# Patient Record
Sex: Female | Born: 1950 | Race: Black or African American | Hispanic: No | Marital: Married | Smoking: Never smoker
Health system: Southern US, Community
[De-identification: ages and names within clinical notes are randomized; demographics above are authoritative.]

## PROBLEM LIST (undated history)

## (undated) DIAGNOSIS — I10 Essential (primary) hypertension: Secondary | ICD-10-CM

## (undated) DIAGNOSIS — K219 Gastro-esophageal reflux disease without esophagitis: Secondary | ICD-10-CM

## (undated) DIAGNOSIS — R7303 Prediabetes: Secondary | ICD-10-CM

---

## 1997-10-15 HISTORY — PX: ABDOMINAL HYSTERECTOMY: SUR658

## 2009-07-12 ENCOUNTER — Ambulatory Visit: Payer: Self-pay | Admitting: Internal Medicine

## 2011-06-05 IMAGING — CR DG ANKLE COMPLETE 3+V*L*
1 series · 4 of 4 positions shown · non-contrast
Comparison: none

REASON FOR EXAM: Ankle Sprain
COMMENTS:

PROCEDURE:     DXR - DXR ANKLE LEFT COMPLETE  - July 12, 2009 [DATE]
RESULT:     Images of the left ankle demonstrate what appears to be soft
tissue swelling. There is no fracture, dislocation or radiopaque foreign
body.

[Series 1: view not recorded · 0.17mm/px · 4 of 4 slices shown]
[im 1/4]
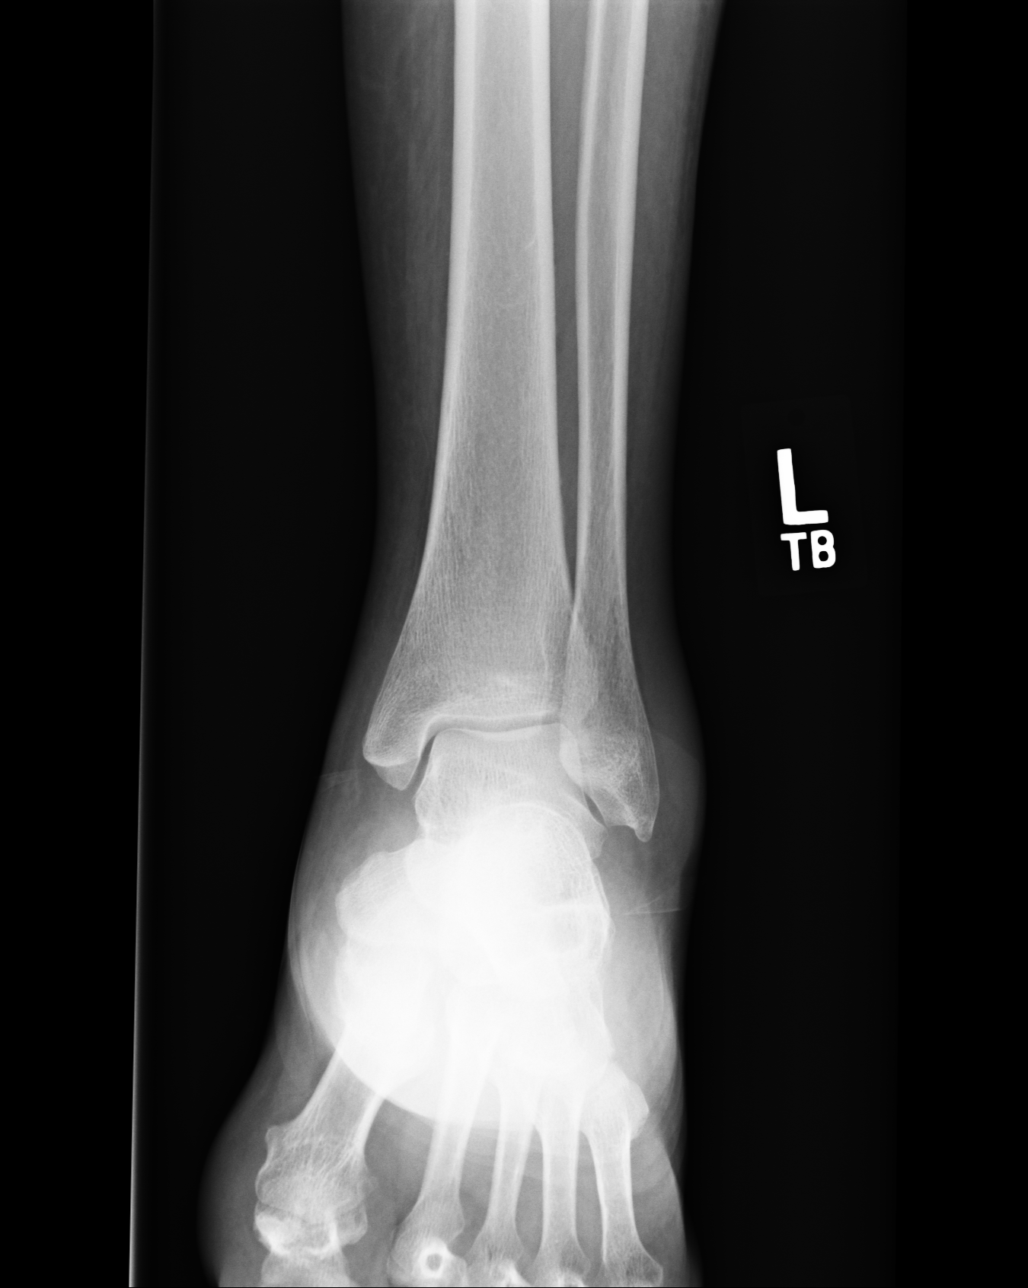
[im 2/4]
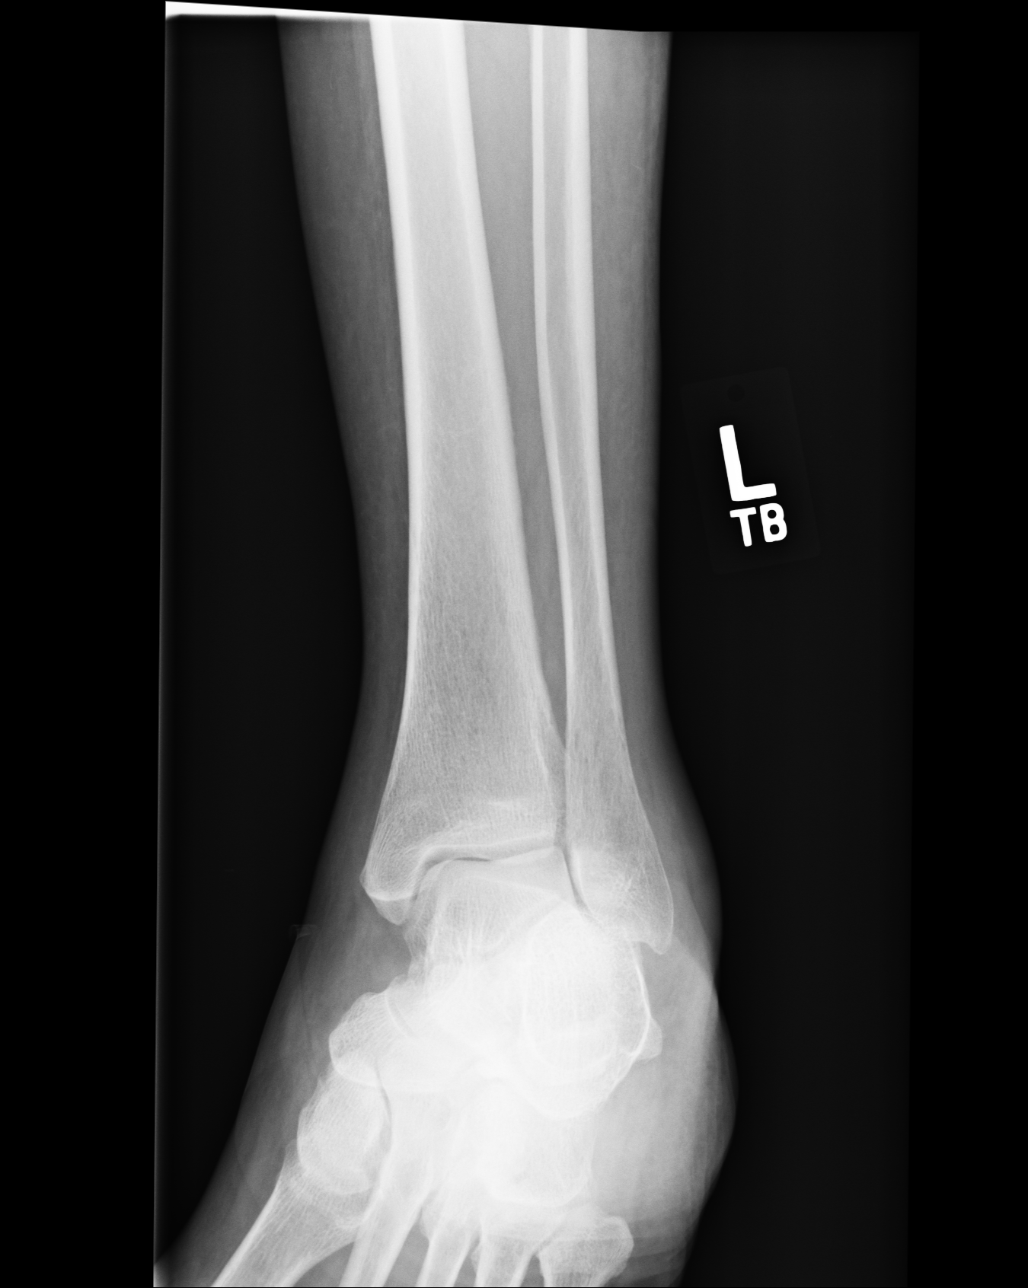
[im 3/4]
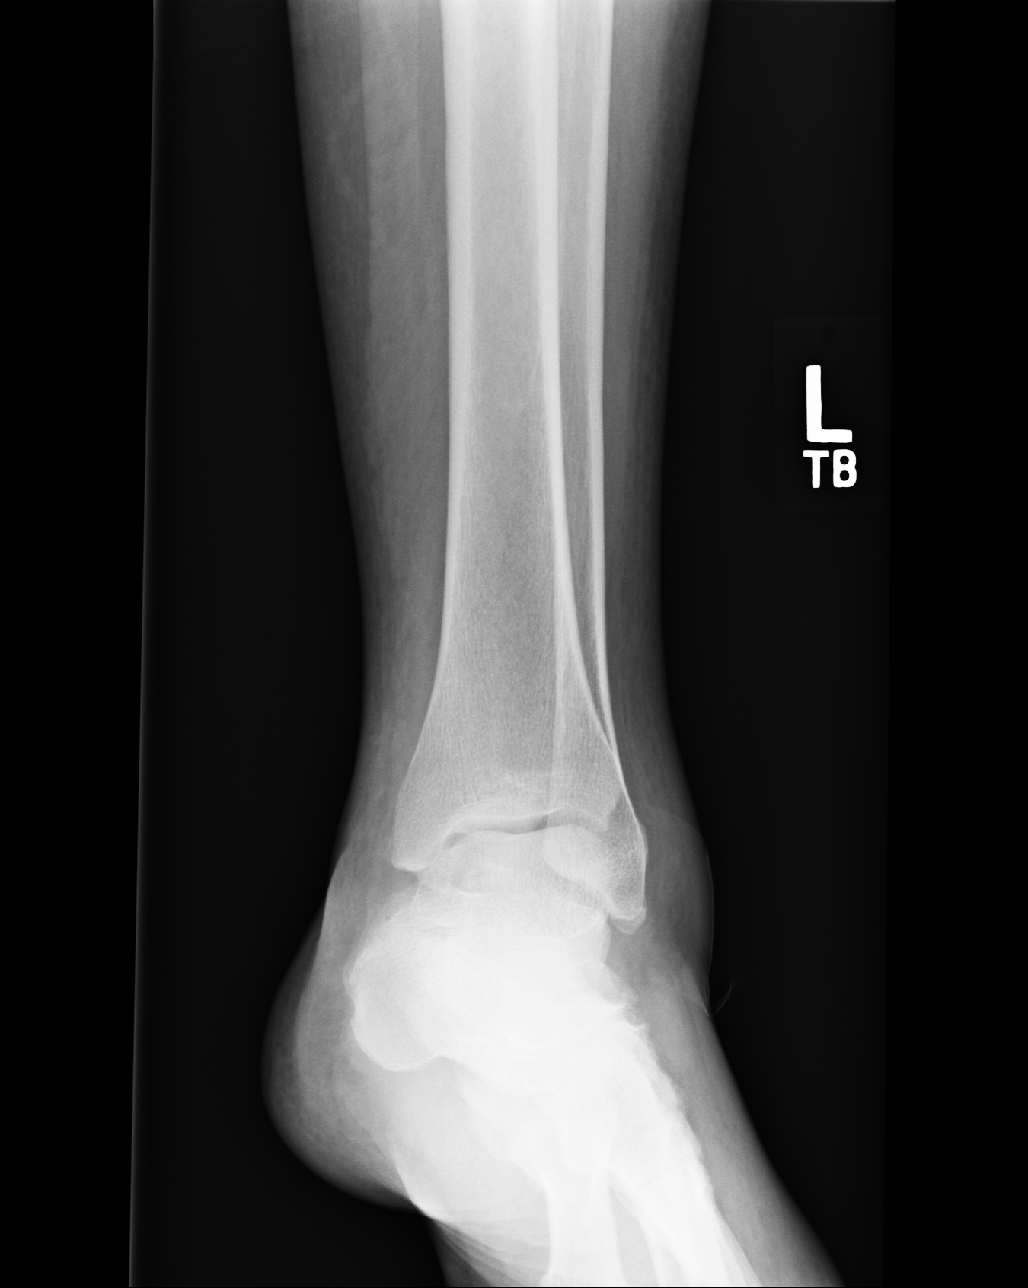
[im 4/4]
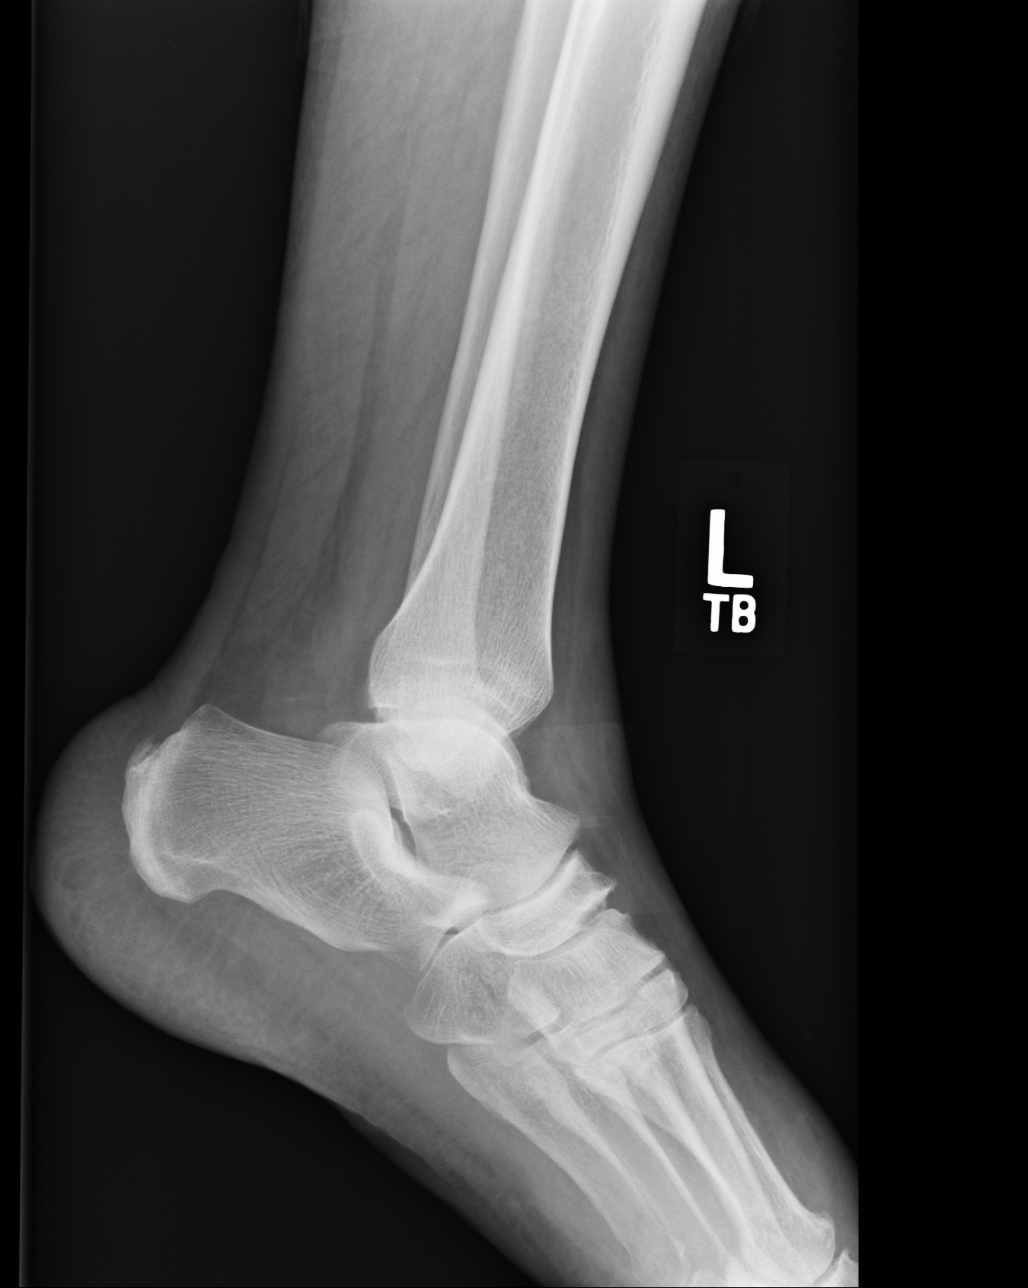

[4 of 4 positions shown; findings below may reference images not displayed]

IMPRESSION: Soft tissue swelling. No definite fracture demonstrated.
Followup images would be recommended if the patient's condition does not
improve.

## 2014-09-14 HISTORY — PX: EYE SURGERY: SHX253

## 2015-02-14 ENCOUNTER — Ambulatory Visit
Admission: EM | Admit: 2015-02-14 | Discharge: 2015-02-14 | Disposition: A | Discharge status: Home / Self Care | Payer: No Typology Code available for payment source | Attending: Family Medicine | Admitting: Family Medicine

## 2015-02-14 DIAGNOSIS — S39012A Strain of muscle, fascia and tendon of lower back, initial encounter: Secondary | ICD-10-CM | POA: Diagnosis not present

## 2015-02-14 DIAGNOSIS — M5432 Sciatica, left side: Secondary | ICD-10-CM

## 2015-02-14 HISTORY — DX: Gastro-esophageal reflux disease without esophagitis: K21.9

## 2015-02-14 HISTORY — DX: Essential (primary) hypertension: I10

## 2015-02-14 HISTORY — DX: Prediabetes: R73.03

## 2015-02-14 MED ORDER — HYDROCODONE-ACETAMINOPHEN 5-325 MG PO TABS: 1.0000 | ORAL_TABLET | Freq: Three times a day (TID) | ORAL | Status: AC

## 2015-02-14 MED ORDER — CYCLOBENZAPRINE HCL 10 MG PO TABS: 10.0000 mg | ORAL_TABLET | Freq: Three times a day (TID) | ORAL | Status: AC | PRN

## 2015-02-14 NOTE — Discharge Instructions (Signed)

## 2015-02-14 NOTE — ED Provider Notes (Signed)
CSN: [id number]     Arrival date & time 02/14/15  1452 History   First MD Initiated Contact with Patient 02/14/15 1542     Chief Complaint  Patient presents with  . Back Pain    Started thursday last week   (Consider location/radiation/quality/duration/timing/severity/associated sxs/prior Treatment) Patient is a 64 y.o. female presenting with back pain. The history is provided by the patient.  Back Pain Location:  Lumbar spine Radiates to:  L posterior upper leg Onset quality:  Sudden Timing:  Intermittent Context: lifting heavy objects and twisting   Context: not jumping from heights, not MVA, not occupational injury, not recent illness and not recent injury   Context comment:  Denies falls or specific injuries; started after she had been doing some lifting a mop bucket Relieved by:  Narcotics (took some left over oxycodone from previous problem and it helped some) Worsened by:  Twisting Associated symptoms: no abdominal pain, no bladder incontinence, no bowel incontinence, no dysuria, no fever, no numbness, no paresthesias, no perianal numbness, no weakness and no weight loss   Risk factors: no recent surgery     Past Medical History  Diagnosis Date  . Hypertension   . Borderline diabetes   . GERD (gastroesophageal reflux disease)    Past Surgical History  Procedure Laterality Date  . Abdominal hysterectomy  1999  . Eye surgery  09/2014   Family History  Problem Relation Age of Onset  . Stroke Father   . Dementia Mother    History  Substance Use Topics  . Smoking status: Never Smoker   . Smokeless tobacco: Not on file  . Alcohol Use: No   OB History    No data available     Review of Systems  Constitutional: Negative for fever and weight loss.  Gastrointestinal: Negative for abdominal pain and bowel incontinence.  Genitourinary: Negative for bladder incontinence and dysuria.  Musculoskeletal: Positive for back pain.  Neurological: Negative for weakness,  numbness and paresthesias.    Allergies  Metoprolol  Home Medications   Prior to Admission medications   Medication Sig Start Date End Date Taking? Authorizing Provider  amLODipine (NORVASC) 5 MG tablet Take 5 mg by mouth daily.   Yes Historical Provider, MD  lisinopril (PRINIVIL,ZESTRIL) 20 MG tablet Take 20 mg by mouth daily.   Yes Historical Provider, MD  omeprazole (PRILOSEC) 40 MG capsule Take 40 mg by mouth daily.   Yes Historical Provider, MD  cyclobenzaprine (FLEXERIL) 10 MG tablet Take 1 tablet (10 mg total) by mouth 3 (three) times daily as needed for muscle spasms. 02/14/15   Payton Mccallumrlando Charlot Gouin, MD  HYDROcodone-acetaminophen (NORCO/VICODIN) 5-325 MG per tablet Take 1-2 tablets by mouth every 8 (eight) hours. 02/14/15   Payton Mccallumrlando Jisele Price, MD   BP 132/76 mmHg  Pulse 62  Temp(Src) 98.5 F (36.9 C) (Tympanic)  Resp 18  Ht 5\' 8"  (1.727 m)  Wt 251 lb (113.853 kg)  BMI 38.17 kg/m2  SpO2 95% Physical Exam  Constitutional: She appears well-developed and well-nourished. No distress.  Musculoskeletal: She exhibits tenderness. She exhibits no edema.       Lumbar back: She exhibits tenderness (to lumbar paraspinous muscles bilaterally (L>R)) and spasm (lumbar paraspinous muscles). She exhibits normal range of motion, no bony tenderness, no swelling, no edema, no deformity, no laceration, no pain and normal pulse.  Neurological: She is alert. She has normal reflexes. She displays normal reflexes. She exhibits normal muscle tone.  Skin: Skin is warm and dry. No rash noted.  She is not diaphoretic. No erythema.  Nursing note and vitals reviewed.   ED Course  Procedures (including critical care time) Labs Review Labs Reviewed - No data to display  Imaging Review No results found.   MDM   1. Lumbar strain, initial encounter   2. Sciatica neuralgia, left    Discharge Medication List as of 02/14/2015  3:58 PM    START taking these medications   Details  cyclobenzaprine (FLEXERIL) 10 MG  tablet Take 1 tablet (10 mg total) by mouth 3 (three) times daily as needed for muscle spasms., Starting 02/14/2015, Until Discontinued, Normal    HYDROcodone-acetaminophen (NORCO/VICODIN) 5-325 MG per tablet Take 1-2 tablets by mouth every 8 (eight) hours., Starting 02/14/2015, Until Discontinued, Print        Plan: 1. diagnosis reviewed with patient 2. rx as per orders; risks, benefits, potential side effects reviewed with patient 3. Recommend supportive treatment with heat to area 4. F/u prn if symptoms worsen or don't improve  Payton Mccallum, MD 02/14/15 1615

## 2015-02-14 NOTE — ED Notes (Signed)
Patient states that pain started on Thursday and radiates through left thigh. Patient states that she has worsening pain with movement, bending or squatting. Patient states that she also has some cramping.

## 2017-03-26 DIAGNOSIS — I10 Essential (primary) hypertension: Secondary | ICD-10-CM | POA: Diagnosis not present

## 2017-03-26 DIAGNOSIS — E0822 Diabetes mellitus due to underlying condition with diabetic chronic kidney disease: Secondary | ICD-10-CM | POA: Diagnosis not present

## 2017-03-26 DIAGNOSIS — N181 Chronic kidney disease, stage 1: Secondary | ICD-10-CM | POA: Diagnosis not present

## 2017-03-26 DIAGNOSIS — R9439 Abnormal result of other cardiovascular function study: Secondary | ICD-10-CM | POA: Diagnosis not present

## 2017-03-26 DIAGNOSIS — R899 Unspecified abnormal finding in specimens from other organs, systems and tissues: Secondary | ICD-10-CM | POA: Diagnosis not present

## 2017-03-26 DIAGNOSIS — Z6841 Body Mass Index (BMI) 40.0 and over, adult: Secondary | ICD-10-CM | POA: Diagnosis not present

## 2017-03-26 DIAGNOSIS — Z9119 Patient's noncompliance with other medical treatment and regimen: Secondary | ICD-10-CM | POA: Diagnosis not present

## 2017-03-26 DIAGNOSIS — R569 Unspecified convulsions: Secondary | ICD-10-CM | POA: Diagnosis not present

## 2017-05-06 DIAGNOSIS — R569 Unspecified convulsions: Secondary | ICD-10-CM | POA: Diagnosis not present

## 2017-05-06 DIAGNOSIS — R42 Dizziness and giddiness: Secondary | ICD-10-CM | POA: Diagnosis not present

## 2017-05-10 DIAGNOSIS — E1122 Type 2 diabetes mellitus with diabetic chronic kidney disease: Secondary | ICD-10-CM | POA: Diagnosis not present

## 2017-05-10 DIAGNOSIS — M542 Cervicalgia: Secondary | ICD-10-CM | POA: Diagnosis not present

## 2017-05-10 DIAGNOSIS — Z6841 Body Mass Index (BMI) 40.0 and over, adult: Secondary | ICD-10-CM | POA: Diagnosis not present

## 2017-05-10 DIAGNOSIS — N183 Chronic kidney disease, stage 3 (moderate): Secondary | ICD-10-CM | POA: Diagnosis not present

## 2017-05-10 DIAGNOSIS — I10 Essential (primary) hypertension: Secondary | ICD-10-CM | POA: Diagnosis not present

## 2017-07-08 ENCOUNTER — Encounter: Payer: Self-pay | Admitting: Emergency Medicine

## 2017-07-08 ENCOUNTER — Emergency Department: Payer: Medicare HMO

## 2017-07-08 ENCOUNTER — Emergency Department
Admission: EM | Admit: 2017-07-08 | Discharge: 2017-07-08 | Disposition: A | Discharge status: Home / Self Care | Payer: Medicare HMO | Attending: Student in an Organized Health Care Education/Training Program | Admitting: Student in an Organized Health Care Education/Training Program

## 2017-07-08 DIAGNOSIS — S93491A Sprain of other ligament of right ankle, initial encounter: Secondary | ICD-10-CM | POA: Insufficient documentation

## 2017-07-08 DIAGNOSIS — M25571 Pain in right ankle and joints of right foot: Secondary | ICD-10-CM | POA: Diagnosis not present

## 2017-07-08 DIAGNOSIS — W07XXXA Fall from chair, initial encounter: Secondary | ICD-10-CM | POA: Diagnosis not present

## 2017-07-08 DIAGNOSIS — Z79899 Other long term (current) drug therapy: Secondary | ICD-10-CM | POA: Insufficient documentation

## 2017-07-08 DIAGNOSIS — I10 Essential (primary) hypertension: Secondary | ICD-10-CM | POA: Insufficient documentation

## 2017-07-08 DIAGNOSIS — Y999 Unspecified external cause status: Secondary | ICD-10-CM | POA: Insufficient documentation

## 2017-07-08 DIAGNOSIS — Y929 Unspecified place or not applicable: Secondary | ICD-10-CM | POA: Diagnosis not present

## 2017-07-08 DIAGNOSIS — Y939 Activity, unspecified: Secondary | ICD-10-CM | POA: Diagnosis not present

## 2017-07-08 MED ORDER — TRAMADOL HCL 50 MG PO TABS: 50.0000 mg | ORAL_TABLET | Freq: Four times a day (QID) | ORAL | 0 refills | Status: AC | PRN

## 2017-07-08 NOTE — ED Triage Notes (Signed)
Patient presents to ED via POV from home with c/o right ankle pain. Patient states she was attempting to sit on a kids stool when she rolled her ankle.

## 2017-07-08 NOTE — ED Provider Notes (Signed)
[facility] Emergency Department Provider Note  ____________________________________________  Time seen: Approximately 9:42 PM  I have reviewed the triage vital signs and the nursing notes.   HISTORY  Chief Complaint Ankle Pain    HPI Krystal [name] is a 66 y.o. female presents to the emergency department with 10 out of 10 [name] ankle pain after patient sustained an inversion type injury 2 days ago while sitting on a child's stool. Patient denies hitting her head or loss of consciousness. She denies weakness, radiculopathy or changes in sensation of the [name] lower extremity. No skin compromise. No alleviating measures have been attempted.  Past Medical History:  Diagnosis Date  . Borderline diabetes   . GERD (gastroesophageal reflux disease)   . Hypertension     There are no active problems to display for this patient.   Past Surgical History:  Procedure Laterality Date  . ABDOMINAL HYSTERECTOMY  1999  . EYE SURGERY  09/2014    Prior to Admission medications   Medication Sig Start Date End Date Taking? Authorizing Provider  amLODipine (NORVASC) 5 MG tablet Take 5 mg by mouth daily.    [provider]  cyclobenzaprine (FLEXERIL) 10 MG tablet Take 1 tablet (10 mg total) by mouth 3 (three) times daily as needed for muscle spasms. 02/14/15   Payton Mccallum, MD  HYDROcodone-acetaminophen (NORCO/VICODIN) 5-325 MG per tablet Take 1-2 tablets by mouth every 8 (eight) hours. 02/14/15   Payton Mccallum, MD  lisinopril (PRINIVIL,ZESTRIL) 20 MG tablet Take 20 mg by mouth daily.    [provider]  omeprazole (PRILOSEC) 40 MG capsule Take 40 mg by mouth daily.    [provider]    Allergies Metoprolol  Family History  Problem Relation Age of Onset  . Stroke Father   . Dementia Mother     Social History Social History  Substance Use Topics  . Smoking status: Never Smoker  . Smokeless tobacco: Not on file  . Alcohol use No      Review of Systems  Constitutional: No fever/chills Eyes: No visual changes. No discharge ENT: No upper respiratory complaints. Cardiovascular: no chest pain. Respiratory: no cough. No SOB. Musculoskeletal: Patient has [name] ankle pain.  Skin: Negative for rash, abrasions, lacerations, ecchymosis. Neurological: Negative for headaches, focal weakness or numbness.   ____________________________________________   PHYSICAL EXAM:  VITAL SIGNS: ED Triage Vitals  Enc Vitals Group     BP 07/08/17 2029 (!) 156/84     Pulse Rate 07/08/17 2029 80     Resp 07/08/17 2029 16     Temp 07/08/17 2029 99.3 F (37.4 C)     Temp Source 07/08/17 2029 Oral     SpO2 07/08/17 2029 97 %     Weight 07/08/17 2029 260 lb (117.9 kg)     Height 07/08/17 2029  (1.575 m)     Head Circumference --      Peak Flow --      Pain Score 07/08/17 2028 8     Pain Loc --      Pain Edu? --      Excl. in GC? --      Constitutional: Alert and oriented. Well appearing and in no acute distress. Eyes: Conjunctivae are normal. PERRL. EOMI. Head: Atraumatic. Cardiovascular: Normal rate, regular rhythm. Normal S1 and S2.  Good peripheral circulation. Respiratory: Normal respiratory effort without tachypnea or retractions. Lungs CTAB. Good air entry to the bases with no decreased or absent breath sounds. Musculoskeletal: Patient  is able to perform limited range of motion at the [name] ankle, likely secondary to pain. She is able to move all 5 [name] toes. No pain with palpation of the course of the [name] fibula. Palpable dorsalis pedis pulse, [name]. Neurologic:  Normal speech and language. No gross focal neurologic deficits are appreciated.  Skin:  Skin is warm, dry and intact. No rash noted. Psychiatric: Mood and affect are normal. Speech and behavior are normal. Patient exhibits appropriate insight and judgement.   ____________________________________________   LABS (all labs ordered are listed, but only  abnormal results are displayed)  Labs Reviewed - No data to display ____________________________________________  EKG   ____________________________________________  RADIOLOGY Krystal [name], personally viewed and evaluated these images (plain radiographs) as part of my medical decision making, as well as reviewing the written report by the radiologist.  Krystal [name]  Result Date: 07/08/2017 CLINICAL DATA:  [name] ankle pain after a fall on Saturday. EXAM: [name] ANKLE - COMPLETE 3+ VIEW COMPARISON:  None. FINDINGS: No fracture. No subluxation or dislocation. Benign appearing sclerotic focus identified distal fibula. IMPRESSION: No acute abnormality. Electronically Signed   By: Kennith Center M.D.   On: 07/08/2017 20:53    ____________________________________________    PROCEDURES  Procedure(s) performed:    Procedures    Medications - No data to display   ____________________________________________   INITIAL IMPRESSION / ASSESSMENT AND PLAN / ED COURSE  Pertinent labs & imaging results that were available during my care of the patient were reviewed by me and considered in my medical decision making (see chart for details).  Review of the [place] CSRS was performed in accordance of the [facility] prior to dispensing any controlled drugs.     Assessment and plan [name] ankle pain Patient presents to the emergency department with [name] ankle pain after an inversion-type injury. X-ray examination reveals no acute fractures or bony abnormalities. On physical exam, patient had pain with palpation of the [name] anterior talofibular ligament. An ace wrap was applied in the emergency department and crutches were provided. Rest, ice, compression and elevation were recommended. A referral was made to podiatry. Patient was discharged with tramadol. All patient questions were answered.    ____________________________________________  FINAL CLINICAL IMPRESSION(S) / ED  DIAGNOSES  Final diagnoses:  None      NEW MEDICATIONS STARTED DURING THIS VISIT:  New Prescriptions   No medications on file        This chart was dictated using voice recognition software/Dragon. Despite best efforts to proofread, errors can occur which can change the meaning. Any change was purely unintentional.    Krystal [name] 07/08/17 2153    Krystal Eddy, MD 07/08/17 [phone]

## 2017-08-23 DIAGNOSIS — E1122 Type 2 diabetes mellitus with diabetic chronic kidney disease: Secondary | ICD-10-CM | POA: Diagnosis not present

## 2017-08-23 DIAGNOSIS — J208 Acute bronchitis due to other specified organisms: Secondary | ICD-10-CM | POA: Diagnosis not present

## 2017-08-23 DIAGNOSIS — B9689 Other specified bacterial agents as the cause of diseases classified elsewhere: Secondary | ICD-10-CM | POA: Diagnosis not present

## 2017-08-23 DIAGNOSIS — I10 Essential (primary) hypertension: Secondary | ICD-10-CM | POA: Diagnosis not present

## 2017-08-23 DIAGNOSIS — N183 Chronic kidney disease, stage 3 (moderate): Secondary | ICD-10-CM | POA: Diagnosis not present

## 2017-11-29 DIAGNOSIS — E1122 Type 2 diabetes mellitus with diabetic chronic kidney disease: Secondary | ICD-10-CM | POA: Diagnosis not present

## 2017-11-29 DIAGNOSIS — N183 Chronic kidney disease, stage 3 (moderate): Secondary | ICD-10-CM | POA: Diagnosis not present

## 2017-11-29 DIAGNOSIS — I10 Essential (primary) hypertension: Secondary | ICD-10-CM | POA: Diagnosis not present

## 2017-11-29 DIAGNOSIS — K219 Gastro-esophageal reflux disease without esophagitis: Secondary | ICD-10-CM | POA: Diagnosis not present

## 2018-03-17 ENCOUNTER — Other Ambulatory Visit: Payer: Self-pay | Admitting: Pharmacist

## 2018-03-17 NOTE — Patient Outreach (Addendum)
[facility]) Care Management  03/17/2018  Krystal [name] 02-May-1951 [id number]   Incoming call from Ronnie Doss in response to the [facility] Medication Adherence Campaign. Speak with patient. HIPAA identifiers verified and verbal consent received.  [name] reports that she takes her pioglitazone once daily as directed, but that she often misses doses of this medication because she does not like the side effects. Patient reports having experienced fluctuating blood sugars, weight gain and "feeling bad" with this medication. Counsel patient about the importance of blood sugar control. Note that patient's most recent A1C in [place] was 7.5% and her eGFR was ~54 mL/min on 11/29/17.   Also note that per Epic Chart review, patient's PCP previously prescribed the patient Januvia for blood sugar control, but the patient reported that she was unable to afford the medication at that time because the copayment was ~$300. Note that per the [place], Chilton [place] is a tier 3 option for the patient, with a $45 copayment. Januvia is a weight-neutral option. Patient has a BMI over 40.  [name] reports that over time this $45/month copayment would be difficult for her to afford. Review with patient the requirements for Extra Help through Social Security for lower drug costs. [name] reports that she would qualify. Patient states that she will complete this application online today. Provide patient with instructions for accessing the application online. Let patient know that, once she has submitted the application, she should expect to receive a response in the mail in 2-4 weeks. Advise patient that, even if she is denied for Extra Help, we can assist her with applying for assistance with the cost of the medication directly from the manufacturer, [employer].  [name] requests that I reach out to her PCP, Dr. Candiss Norse to let the provider know that [name] is currently not  consistently taking her pioglitazone due to side effects and to ask if she would consider switching the patient back to [facility] for improved tolerability and adherence.  PLAN:  1) Will call to follow up with patient's PCP, Dr. Candiss Norse now regarding potentially switching patient from pioglitazone to [facility] for improved medication tolerability and adherence.  2) Will call to follow up with [name] once I have heard back from Dr. Candiss Norse.  Harlow Asa, PharmD, [place] Management [phone]

## 2018-03-17 NOTE — Patient Outreach (Signed)
[facility]) Care Management  03/17/2018  Krystal [name] 11/10/1950 [id number]   Call to follow up with patient's PCP, Dr. Thedore MinsSingh regarding potentially switching patient from [facility]oglitazone to [facility] for improved medication tolerability and adherence. Leave a message with [name] in Dr. Doristine ChurchSingh's office.  [name], PharmD, [facility] CenterBCACP Clinical Pharmacist [facility] Care Management [phone]

## 2018-03-18 ENCOUNTER — Other Ambulatory Visit: Payer: Self-pay | Admitting: Pharmacy Technician

## 2018-03-18 ENCOUNTER — Other Ambulatory Visit: Payer: Self-pay | Admitting: Pharmacist

## 2018-03-18 NOTE — Patient Outreach (Signed)
[facility]) Care Management  03/18/2018  Krystal [name] [birth year] [id number]   Received [employer] patient assistance referral from [facility] RPh Duanne MoronElisabeth Dhalla for Januvia. Prepared patient and provider Thedore Mins(Singh) portion to be mailed out.  Will follow up with patient in the next 7-10 business days to verify application has been received.  Suzan SlickAshley N. Ernesta Ambleoleman, CPhT [facility] Care Management [phone]

## 2018-03-18 NOTE — Patient Outreach (Signed)
[facility]) Care Management  03/18/2018  Otila BackFaye R Demetro Aug 28, 1951 [id number]   Outreach to follow up with patient regarding medication assistance. Note that per Epic chart review, patient's PCP, Dr. Thedore MinsSingh agreed that patient may switch from Actos to [facility] for cost savings/improved adherence. Called and spoke with patient. HIPAA identifiers verified and verbal consent received.  Ms. Gayleen OremOldham reports that she got the call from her PCP's office regarding this change. Reports that she does not currently have the $45 copayment to be able to pick up the Januvia. Reports that she just picked up a refill of her pioglitazone and that she plans to just continue this medication for now until she is able to switch. Patient would like our help with applying for Patient Assistance from [employer] for the Januvia. In addition, Ms. Gayleen OremOldham reports that she is going to complete the extra help application online and that she has also reached out to [facility] about having her insurance premium lowered based on her financial information.   PLAN  1) Will send an InBasket message to Pharmacy Technician Lilla Shookshley Coleman to request that she reach out to Ms. Effie ShyColeman to assist her with applying for patient assistance for Januvia from [employer].  2) Will follow with Morrie Sheldonshley as she assists Ms. Matsushita with this application.  Duanne MoronElisabeth Easten Maceachern, PharmD, [facility]BCACP Clinical Pharmacist [facility] Care Management [phone]

## 2018-03-26 DIAGNOSIS — E1122 Type 2 diabetes mellitus with diabetic chronic kidney disease: Secondary | ICD-10-CM | POA: Diagnosis not present

## 2018-03-26 DIAGNOSIS — N183 Chronic kidney disease, stage 3 (moderate): Secondary | ICD-10-CM | POA: Diagnosis not present

## 2018-04-02 DIAGNOSIS — N183 Chronic kidney disease, stage 3 (moderate): Secondary | ICD-10-CM | POA: Diagnosis not present

## 2018-04-02 DIAGNOSIS — E1122 Type 2 diabetes mellitus with diabetic chronic kidney disease: Secondary | ICD-10-CM | POA: Diagnosis not present

## 2018-04-11 ENCOUNTER — Other Ambulatory Visit: Payer: Self-pay | Admitting: Pharmacy Technician

## 2018-04-11 NOTE — Patient Outreach (Signed)
[facility]) Care Management  04/11/2018  Krystal [name] April 28, 1951 [id number]   Unsuccessful outreach attempt #1 in regards to [employer] patient assistance, Left HIPAA compliant voicemail.  Will make call attempt #2 in 2-3 business days if call is not returned.  Suzan SlickAshley N. Ernesta Ambleoleman, CPhT [facility] Care Management [phone]

## 2018-04-16 ENCOUNTER — Ambulatory Visit: Payer: Self-pay | Admitting: Pharmacy Technician

## 2018-04-18 ENCOUNTER — Other Ambulatory Visit: Payer: Self-pay | Admitting: Pharmacy Technician

## 2018-04-18 NOTE — Patient Outreach (Signed)
[facility]) Care Management  04/18/2018  Krystal [name] 1951/08/02 [id number]   Successful call attempt, HIPAA identifiers verified. Krystal [name] states that she did receive the [employer] patient assistance application, she also stated that she received letters in the mail from [facility] denying a medication but she wasn't clear as to if it had to do with Krystal [name]. She stated she went to her providers office for a check up and Dr. Thedore MinsSingh gave her the impression that she didn't know anything about the letter. Krystal [name] was unclear about what letter she was asking the provider about... When I asked her if she wanted to proceed with the application process she stated she wasn't sure. I told her that Dr. Thedore MinsSingh had already mailed in her portion of the application for Krystal [name] so I would just need her portion. She still stated that she doesn't know what she should do. I suggested that she attempt to contact Dr. Doristine ChurchSingh's office and verify if Krystal [name] is a drug that she should continue on and if they agree that it is, she can contact me if she wants to proceed with the process.  Will route note to [facility] Duanne MoronElisabeth Dhalla for case closure.  Suzan SlickAshley N. Ernesta Ambleoleman, CPhT [facility] Care Management [phone]

## 2018-04-23 ENCOUNTER — Other Ambulatory Visit: Payer: Self-pay | Admitting: Pharmacist

## 2018-04-23 NOTE — Patient Outreach (Signed)
[facility]) Care Management  04/23/2018  Krystal [name] 1951-01-01 [id number]   Call to follow up with patient regarding medication assistance for Januvia. Leave a HIPAA compliant message on the patient's voicemail. If have not heard from patient by 04/25/18, will give her another call at that time.  Duanne MoronElisabeth Shoshanah Dapper, PharmD, [facility] ViewBCACP Clinical Pharmacist [facility] Care Management [phone]

## 2018-04-25 ENCOUNTER — Other Ambulatory Visit: Payer: Self-pay | Admitting: Pharmacist

## 2018-04-25 NOTE — Patient Outreach (Signed)
[facility]) Care Management  04/25/2018  Krystal [name] 1950-10-21 [id number]   Call to follow up with [name] regarding medication assistance for Januvia. HIPAA identifiers verified and verbal consent received.  Patient reports that she received a letter in the mail from [employer] denying the Januvia. Counsel patient that the process for applying for Patient Assistance for Januvia through the manufacturer is a separate program from her insurance. Again, walk patient through the application process. Patient confirms that she is aware that her PCP would like for her to start on the Januvia. However, patient expresses that she still feels uneasy with how many steps there are to this application process. Reports that she is not sure if she would like to proceed with the application at this time and that she would prefer to just give me a call back if/when she is ready. Confirm that patient has my phone number.  Will close pharmacy episode at this time.  Duanne MoronElisabeth Tomeka Kantner, PharmD, [facility]BCACP Clinical Pharmacist [facility] Care Management [phone]

## 2018-06-27 DIAGNOSIS — E1122 Type 2 diabetes mellitus with diabetic chronic kidney disease: Secondary | ICD-10-CM | POA: Diagnosis not present

## 2018-06-27 DIAGNOSIS — N183 Chronic kidney disease, stage 3 (moderate): Secondary | ICD-10-CM | POA: Diagnosis not present

## 2018-07-04 DIAGNOSIS — I10 Essential (primary) hypertension: Secondary | ICD-10-CM | POA: Diagnosis not present

## 2018-07-04 DIAGNOSIS — E1122 Type 2 diabetes mellitus with diabetic chronic kidney disease: Secondary | ICD-10-CM | POA: Diagnosis not present

## 2018-07-04 DIAGNOSIS — Z780 Asymptomatic menopausal state: Secondary | ICD-10-CM | POA: Diagnosis not present

## 2018-07-04 DIAGNOSIS — Z6841 Body Mass Index (BMI) 40.0 and over, adult: Secondary | ICD-10-CM | POA: Diagnosis not present

## 2018-07-04 DIAGNOSIS — N183 Chronic kidney disease, stage 3 (moderate): Secondary | ICD-10-CM | POA: Diagnosis not present

## 2018-07-04 DIAGNOSIS — Z0000 Encounter for general adult medical examination without abnormal findings: Secondary | ICD-10-CM | POA: Diagnosis not present

## 2018-09-09 DIAGNOSIS — J069 Acute upper respiratory infection, unspecified: Secondary | ICD-10-CM | POA: Diagnosis not present

## 2018-09-09 DIAGNOSIS — E1122 Type 2 diabetes mellitus with diabetic chronic kidney disease: Secondary | ICD-10-CM | POA: Diagnosis not present

## 2018-09-09 DIAGNOSIS — N183 Chronic kidney disease, stage 3 (moderate): Secondary | ICD-10-CM | POA: Diagnosis not present

## 2018-09-26 DIAGNOSIS — N183 Chronic kidney disease, stage 3 (moderate): Secondary | ICD-10-CM | POA: Diagnosis not present

## 2018-09-26 DIAGNOSIS — E1122 Type 2 diabetes mellitus with diabetic chronic kidney disease: Secondary | ICD-10-CM | POA: Diagnosis not present

## 2018-10-06 DIAGNOSIS — R05 Cough: Secondary | ICD-10-CM | POA: Diagnosis not present

## 2018-10-06 DIAGNOSIS — N183 Chronic kidney disease, stage 3 (moderate): Secondary | ICD-10-CM | POA: Diagnosis not present

## 2018-10-06 DIAGNOSIS — I10 Essential (primary) hypertension: Secondary | ICD-10-CM | POA: Diagnosis not present

## 2018-10-06 DIAGNOSIS — E1122 Type 2 diabetes mellitus with diabetic chronic kidney disease: Secondary | ICD-10-CM | POA: Diagnosis not present

## 2018-10-06 DIAGNOSIS — M170 Bilateral primary osteoarthritis of knee: Secondary | ICD-10-CM | POA: Diagnosis not present

## 2018-10-30 DIAGNOSIS — E1122 Type 2 diabetes mellitus with diabetic chronic kidney disease: Secondary | ICD-10-CM | POA: Diagnosis not present

## 2018-10-30 DIAGNOSIS — N183 Chronic kidney disease, stage 3 (moderate): Secondary | ICD-10-CM | POA: Diagnosis not present

## 2018-10-30 DIAGNOSIS — M722 Plantar fascial fibromatosis: Secondary | ICD-10-CM | POA: Diagnosis not present

## 2019-02-17 DIAGNOSIS — E1122 Type 2 diabetes mellitus with diabetic chronic kidney disease: Secondary | ICD-10-CM | POA: Diagnosis not present

## 2019-02-17 DIAGNOSIS — I10 Essential (primary) hypertension: Secondary | ICD-10-CM | POA: Diagnosis not present

## 2019-02-17 DIAGNOSIS — Z6841 Body Mass Index (BMI) 40.0 and over, adult: Secondary | ICD-10-CM | POA: Diagnosis not present

## 2019-02-17 DIAGNOSIS — N183 Chronic kidney disease, stage 3 (moderate): Secondary | ICD-10-CM | POA: Diagnosis not present

## 2019-02-17 DIAGNOSIS — Z0000 Encounter for general adult medical examination without abnormal findings: Secondary | ICD-10-CM | POA: Diagnosis not present

## 2019-03-18 DIAGNOSIS — Z961 Presence of intraocular lens: Secondary | ICD-10-CM | POA: Diagnosis not present

## 2019-03-18 DIAGNOSIS — H524 Presbyopia: Secondary | ICD-10-CM | POA: Diagnosis not present

## 2019-03-18 DIAGNOSIS — H52223 Regular astigmatism, bilateral: Secondary | ICD-10-CM | POA: Diagnosis not present

## 2019-03-18 DIAGNOSIS — E119 Type 2 diabetes mellitus without complications: Secondary | ICD-10-CM | POA: Diagnosis not present

## 2019-03-18 DIAGNOSIS — H04123 Dry eye syndrome of bilateral lacrimal glands: Secondary | ICD-10-CM | POA: Diagnosis not present

## 2019-03-18 DIAGNOSIS — Z0100 Encounter for examination of eyes and vision without abnormal findings: Secondary | ICD-10-CM | POA: Diagnosis not present

## 2019-03-18 DIAGNOSIS — Z7984 Long term (current) use of oral hypoglycemic drugs: Secondary | ICD-10-CM | POA: Diagnosis not present

## 2019-03-18 DIAGNOSIS — H5213 Myopia, bilateral: Secondary | ICD-10-CM | POA: Diagnosis not present

## 2019-05-13 DIAGNOSIS — E1122 Type 2 diabetes mellitus with diabetic chronic kidney disease: Secondary | ICD-10-CM | POA: Diagnosis not present

## 2019-05-13 DIAGNOSIS — N183 Chronic kidney disease, stage 3 (moderate): Secondary | ICD-10-CM | POA: Diagnosis not present

## 2019-05-13 DIAGNOSIS — I10 Essential (primary) hypertension: Secondary | ICD-10-CM | POA: Diagnosis not present

## 2019-11-25 DIAGNOSIS — I129 Hypertensive chronic kidney disease with stage 1 through stage 4 chronic kidney disease, or unspecified chronic kidney disease: Secondary | ICD-10-CM | POA: Diagnosis not present

## 2019-11-25 DIAGNOSIS — E1122 Type 2 diabetes mellitus with diabetic chronic kidney disease: Secondary | ICD-10-CM | POA: Diagnosis not present

## 2019-11-25 DIAGNOSIS — E785 Hyperlipidemia, unspecified: Secondary | ICD-10-CM | POA: Diagnosis not present

## 2019-11-25 DIAGNOSIS — N183 Chronic kidney disease, stage 3 unspecified: Secondary | ICD-10-CM | POA: Diagnosis not present

## 2019-11-25 DIAGNOSIS — M25511 Pain in right shoulder: Secondary | ICD-10-CM | POA: Diagnosis not present

## 2019-11-25 DIAGNOSIS — Z87891 Personal history of nicotine dependence: Secondary | ICD-10-CM | POA: Diagnosis not present

## 2019-12-05 DIAGNOSIS — R519 Headache, unspecified: Secondary | ICD-10-CM | POA: Diagnosis not present

## 2019-12-05 DIAGNOSIS — Z7982 Long term (current) use of aspirin: Secondary | ICD-10-CM | POA: Diagnosis not present

## 2019-12-05 DIAGNOSIS — M109 Gout, unspecified: Secondary | ICD-10-CM | POA: Diagnosis not present

## 2019-12-05 DIAGNOSIS — R5383 Other fatigue: Secondary | ICD-10-CM | POA: Diagnosis not present

## 2019-12-05 DIAGNOSIS — R600 Localized edema: Secondary | ICD-10-CM | POA: Diagnosis not present

## 2019-12-05 DIAGNOSIS — K219 Gastro-esophageal reflux disease without esophagitis: Secondary | ICD-10-CM | POA: Diagnosis not present

## 2019-12-05 DIAGNOSIS — E119 Type 2 diabetes mellitus without complications: Secondary | ICD-10-CM | POA: Diagnosis not present

## 2019-12-05 DIAGNOSIS — I129 Hypertensive chronic kidney disease with stage 1 through stage 4 chronic kidney disease, or unspecified chronic kidney disease: Secondary | ICD-10-CM | POA: Diagnosis not present

## 2019-12-05 DIAGNOSIS — I10 Essential (primary) hypertension: Secondary | ICD-10-CM | POA: Diagnosis not present

## 2019-12-05 DIAGNOSIS — R29818 Other symptoms and signs involving the nervous system: Secondary | ICD-10-CM | POA: Diagnosis not present

## 2019-12-05 DIAGNOSIS — E785 Hyperlipidemia, unspecified: Secondary | ICD-10-CM | POA: Diagnosis not present

## 2019-12-05 DIAGNOSIS — R0789 Other chest pain: Secondary | ICD-10-CM | POA: Diagnosis not present

## 2019-12-07 DIAGNOSIS — I10 Essential (primary) hypertension: Secondary | ICD-10-CM | POA: Diagnosis not present

## 2019-12-07 DIAGNOSIS — Z87891 Personal history of nicotine dependence: Secondary | ICD-10-CM | POA: Diagnosis not present

## 2019-12-15 ENCOUNTER — Other Ambulatory Visit: Payer: Self-pay | Admitting: Family Medicine

## 2019-12-15 DIAGNOSIS — Z87891 Personal history of nicotine dependence: Secondary | ICD-10-CM | POA: Diagnosis not present

## 2019-12-15 DIAGNOSIS — I10 Essential (primary) hypertension: Secondary | ICD-10-CM

## 2019-12-22 ENCOUNTER — Ambulatory Visit
Admission: RE | Admit: 2019-12-22 | Discharge: 2019-12-22 | Disposition: A | Discharge status: Home / Self Care | Payer: Medicare HMO | Source: Ambulatory Visit | Attending: Family Medicine | Admitting: Family Medicine

## 2019-12-22 ENCOUNTER — Other Ambulatory Visit: Payer: Self-pay

## 2019-12-22 DIAGNOSIS — I129 Hypertensive chronic kidney disease with stage 1 through stage 4 chronic kidney disease, or unspecified chronic kidney disease: Secondary | ICD-10-CM | POA: Insufficient documentation

## 2019-12-22 DIAGNOSIS — E1122 Type 2 diabetes mellitus with diabetic chronic kidney disease: Secondary | ICD-10-CM | POA: Diagnosis not present

## 2019-12-22 DIAGNOSIS — Z87891 Personal history of nicotine dependence: Secondary | ICD-10-CM | POA: Diagnosis not present

## 2019-12-22 DIAGNOSIS — I10 Essential (primary) hypertension: Secondary | ICD-10-CM

## 2019-12-22 DIAGNOSIS — N183 Chronic kidney disease, stage 3 unspecified: Secondary | ICD-10-CM | POA: Diagnosis not present

## 2019-12-22 DIAGNOSIS — Z79899 Other long term (current) drug therapy: Secondary | ICD-10-CM | POA: Diagnosis not present

## 2019-12-30 DIAGNOSIS — N183 Chronic kidney disease, stage 3 unspecified: Secondary | ICD-10-CM | POA: Diagnosis not present

## 2020-01-11 DIAGNOSIS — E1122 Type 2 diabetes mellitus with diabetic chronic kidney disease: Secondary | ICD-10-CM | POA: Diagnosis not present

## 2020-01-11 DIAGNOSIS — R569 Unspecified convulsions: Secondary | ICD-10-CM | POA: Diagnosis not present

## 2020-01-11 DIAGNOSIS — Z79899 Other long term (current) drug therapy: Secondary | ICD-10-CM | POA: Diagnosis not present

## 2020-01-11 DIAGNOSIS — N183 Chronic kidney disease, stage 3 unspecified: Secondary | ICD-10-CM | POA: Diagnosis not present

## 2020-01-11 DIAGNOSIS — I129 Hypertensive chronic kidney disease with stage 1 through stage 4 chronic kidney disease, or unspecified chronic kidney disease: Secondary | ICD-10-CM | POA: Diagnosis not present

## 2020-04-08 DIAGNOSIS — N183 Chronic kidney disease, stage 3 unspecified: Secondary | ICD-10-CM | POA: Diagnosis not present

## 2020-04-08 DIAGNOSIS — I10 Essential (primary) hypertension: Secondary | ICD-10-CM | POA: Diagnosis not present

## 2020-04-08 DIAGNOSIS — E1122 Type 2 diabetes mellitus with diabetic chronic kidney disease: Secondary | ICD-10-CM | POA: Diagnosis not present

## 2020-04-12 DIAGNOSIS — I129 Hypertensive chronic kidney disease with stage 1 through stage 4 chronic kidney disease, or unspecified chronic kidney disease: Secondary | ICD-10-CM | POA: Diagnosis not present

## 2020-04-12 DIAGNOSIS — E785 Hyperlipidemia, unspecified: Secondary | ICD-10-CM | POA: Diagnosis not present

## 2020-04-12 DIAGNOSIS — N183 Chronic kidney disease, stage 3 unspecified: Secondary | ICD-10-CM | POA: Diagnosis not present

## 2020-04-12 DIAGNOSIS — E1122 Type 2 diabetes mellitus with diabetic chronic kidney disease: Secondary | ICD-10-CM | POA: Diagnosis not present

## 2020-04-12 DIAGNOSIS — Z79899 Other long term (current) drug therapy: Secondary | ICD-10-CM | POA: Diagnosis not present

## 2020-05-05 DIAGNOSIS — H524 Presbyopia: Secondary | ICD-10-CM | POA: Diagnosis not present

## 2020-05-05 DIAGNOSIS — Z0100 Encounter for examination of eyes and vision without abnormal findings: Secondary | ICD-10-CM | POA: Diagnosis not present

## 2020-05-10 DIAGNOSIS — M545 Low back pain: Secondary | ICD-10-CM | POA: Diagnosis not present

## 2020-05-10 DIAGNOSIS — Z6841 Body Mass Index (BMI) 40.0 and over, adult: Secondary | ICD-10-CM | POA: Diagnosis not present

## 2020-06-13 DIAGNOSIS — K219 Gastro-esophageal reflux disease without esophagitis: Secondary | ICD-10-CM | POA: Diagnosis not present

## 2020-06-13 DIAGNOSIS — I129 Hypertensive chronic kidney disease with stage 1 through stage 4 chronic kidney disease, or unspecified chronic kidney disease: Secondary | ICD-10-CM | POA: Diagnosis not present

## 2020-06-13 DIAGNOSIS — N183 Chronic kidney disease, stage 3 unspecified: Secondary | ICD-10-CM | POA: Diagnosis not present

## 2020-06-13 DIAGNOSIS — Z6841 Body Mass Index (BMI) 40.0 and over, adult: Secondary | ICD-10-CM | POA: Diagnosis not present

## 2020-06-13 DIAGNOSIS — E1122 Type 2 diabetes mellitus with diabetic chronic kidney disease: Secondary | ICD-10-CM | POA: Diagnosis not present

## 2020-06-13 DIAGNOSIS — E785 Hyperlipidemia, unspecified: Secondary | ICD-10-CM | POA: Diagnosis not present

## 2020-07-11 DIAGNOSIS — Z13820 Encounter for screening for osteoporosis: Secondary | ICD-10-CM | POA: Diagnosis not present

## 2020-07-11 DIAGNOSIS — E1122 Type 2 diabetes mellitus with diabetic chronic kidney disease: Secondary | ICD-10-CM | POA: Diagnosis not present

## 2020-07-11 DIAGNOSIS — Z0000 Encounter for general adult medical examination without abnormal findings: Secondary | ICD-10-CM | POA: Diagnosis not present

## 2020-07-11 DIAGNOSIS — N183 Chronic kidney disease, stage 3 unspecified: Secondary | ICD-10-CM | POA: Diagnosis not present

## 2020-07-11 DIAGNOSIS — E785 Hyperlipidemia, unspecified: Secondary | ICD-10-CM | POA: Diagnosis not present

## 2020-07-11 DIAGNOSIS — I129 Hypertensive chronic kidney disease with stage 1 through stage 4 chronic kidney disease, or unspecified chronic kidney disease: Secondary | ICD-10-CM | POA: Diagnosis not present

## 2020-07-11 DIAGNOSIS — I10 Essential (primary) hypertension: Secondary | ICD-10-CM | POA: Diagnosis not present

## 2020-10-19 DIAGNOSIS — Z780 Asymptomatic menopausal state: Secondary | ICD-10-CM | POA: Diagnosis not present

## 2020-10-25 DIAGNOSIS — N183 Chronic kidney disease, stage 3 unspecified: Secondary | ICD-10-CM | POA: Diagnosis not present

## 2020-10-25 DIAGNOSIS — I10 Essential (primary) hypertension: Secondary | ICD-10-CM | POA: Diagnosis not present

## 2020-10-25 DIAGNOSIS — E785 Hyperlipidemia, unspecified: Secondary | ICD-10-CM | POA: Diagnosis not present

## 2020-10-25 DIAGNOSIS — Z0000 Encounter for general adult medical examination without abnormal findings: Secondary | ICD-10-CM | POA: Diagnosis not present

## 2020-10-25 DIAGNOSIS — E1122 Type 2 diabetes mellitus with diabetic chronic kidney disease: Secondary | ICD-10-CM | POA: Diagnosis not present

## 2020-10-27 ENCOUNTER — Encounter: Payer: Medicare HMO | Admitting: Dietician

## 2020-10-27 ENCOUNTER — Other Ambulatory Visit: Payer: Self-pay

## 2020-10-27 NOTE — Progress Notes (Deleted)
Medical Nutrition Therapy: Visit start time: 1330  end time: 1430  Assessment:  Diagnosis: Type 2 diabetes, CKD stage 3 Past medical history: *** Psychosocial issues/ stress concerns: ***  Preferred learning method:  . Auditory . Visual . Hands-on . No preference indicated  Current weight: ***  Height: *** Medications, supplements: ***  Progress and evaluation:  ***   Physical activity: ***  Dietary Intake:  Usual eating pattern includes *** meals and *** snacks per day. Dining out frequency: *** meals per week.  Breakfast: *** Snack: *** Lunch: *** Snack: *** Supper: *** Snack: *** Beverages: ***  Nutrition Care Education: Topics covered: *** Basic nutrition: basic food groups, appropriate nutrient balance, appropriate meal and snack schedule, general nutrition guidelines    Weight control: identifying healthy weight, determining reasonable weight loss rate, importance of low sugar and low fat choices, portion control strategies including ***, estimated energy needs at *** kcal, provided guidance for *** Advanced nutrition:  recipe modification, cooking techniques, dining out, food label reading Diabetes:  goals for BGs, appropriate meal and snack schedule, appropriate carb intake and balance, healthy carb choices, role of fiber, protein, fat Hypertension:  importance of controlling BP, identifying high sodium foods, identifying food sources of potassium, magnesium Hyperlipidemia:  target goals for lipids, healthy and unhealthy fats, role of fiber, plant sterols, role of exercise Other lifestyle changes:  benefits of making changes, increasing motivation, readiness for change, identifying habits that need to change, alcohol use, food and drug interactions  Nutritional Diagnosis:  {[facility] AMB NUTRITIONAL DIAGNOSIS:[phone]}  Intervention:  . ***  Education Materials given:  . General diet guidelines for Diabetes . General diet guidelines for Cholesterol-lowering/ Heart  health . General diet guidelines for Hypertension . Bariatric surgery guidelines and diet . Food record . Plate Planner . Food label handout . Dining out resource . Food lists/ Planning A Balanced Meal . Recipes . Sample meal pattern/ menus . Snacking handout . Goals/ instructions   Learner/ who was taught:  . Patient  . Spouse/ partner . Family member: *** . Caregiver/ guardian  Level of understanding: . Unable to understand/ needs instruction . Partial understanding; needs review/ practice . Verbalizes/ demonstrates competency . Not applicable  Demonstrated degree of understanding via:   Teach back Learning barriers: . None . Motivation lacking . Cognitive limitations . Communication limitations . Comprehension . Hearing impairment . Language: *** . Learning disability . Literacy . Physical limitations . Visual impairment  Willingness to learn/ readiness for change: . Eager, change in progress . Acceptance, ready for change . Hesitance, contemplating change . Non-acceptance, not ready for change  Monitoring and Evaluation:  Dietary intake, exercise, ***, and body weight      follow up: {follow up:15908}

## 2020-10-28 ENCOUNTER — Encounter: Payer: Medicare HMO | Attending: Family Medicine | Admitting: Dietician

## 2020-10-28 VITALS — Ht 68.0 in | Wt 269.8 lb

## 2020-10-28 DIAGNOSIS — E1122 Type 2 diabetes mellitus with diabetic chronic kidney disease: Secondary | ICD-10-CM | POA: Diagnosis not present

## 2020-10-28 DIAGNOSIS — N183 Chronic kidney disease, stage 3 unspecified: Secondary | ICD-10-CM | POA: Insufficient documentation

## 2020-10-28 DIAGNOSIS — N1831 Chronic kidney disease, stage 3a: Secondary | ICD-10-CM

## 2020-10-28 NOTE — Progress Notes (Signed)
Medical Nutrition Therapy: Visit start time: 0930  end time: 1030  Assessment:  Diagnosis: Type 2 diabetes, CKD stage 3 Past medical history: HTN Psychosocial issues/ stress concerns: none  Preferred learning method:  . Visual   Current weight: 269.8lbs with shoes, sweater Height: 5'8" Medications, supplements: reconciled list in medical record  Progress and evaluation:   Patient reports participating in diabetes program through [facility], but stopped due to travel time/ transportation.    Checks BGs fasting in am recently close to 160s-170.  Has reduced bread intake; rice, potatoes (husband loves these foods), eating salads with meals which include some dried fruit and nuts, lettuce, carrots, beets, spinach, deli meat  Uses sea salt and/or Mrs Sharilyn Sites as well as other alternatives in cooking  She reports having some days that she feels especially hungry and will eat/ snack more often.  Her personal goal for weight is 225lbs.  She reports struggle to find motivation to exercise, does not like to walk in part due to some subsequent foot pain.  Physical activity: none currently  Dietary Intake:  Usual eating pattern includes 2-3 meals and 1-2 snacks per day. Dining out frequency: 0-1 meals per week. Dinner 2x a month, breakfast 2x a month  Breakfast: honey toasted cereal with almonds; honey nut cheerios or raisin bran; skips 20% of the time due to lack of hunger; drinks coffee daily with creamer and stevia Snack: usually none; occasionally cereal; apples; peanut butter and crackers or plain crackers; occasionally small portion leftovers Lunch: 1:30-2pm peanut butter crackers; leftovers (salad +/or meat) Snack: none Supper: 6pm meat/ fish + salad Snack: tries to avoid eating after supper; occasionally fruit Beverages: water 3-6+ bottles daily, coffee in am  Nutrition Care Education: Topics covered:  Basic nutrition: basic food groups, appropriate nutrient balance,  appropriate meal and snack schedule, general nutrition guidelines    Weight control: importance of low sugar and low fat choices, portion control, role of physical activity Diabetes: appropriate carb intake and balance, healthy carb choices, role of fiber, protein, fat Hypertension and renal health: importance of controlling BP, identifying high sodium foods, identifying food sources of potassium, magnesium; controlling portions of meats/ proteins  Other:  Options for exercise, strategies of tracking and rewards for reaching goals, finding enjoyable types of activity.  Nutritional Diagnosis:  [place]-2.2 Altered nutrition-related laboratory As related to Type 2 diabetes and chronic kidney disease.  As evidenced by patient with elevated fasting BGs, recent HbA1C of 8.8%, GFR 54. [place]-3.3 Overweight/obesity As related to history of excess calories and inadequate physical activity.  As evidenced by patient with current BMI of 41, making diet and lifestyle changes to improve health and lose weight.  Intervention:  . Instruction and discussion as noted above. . Patient has been working on positive diet changes, and is motivated to continue.  . Established goals for additional change with input from patient.  . Patient declined scheduling follow-up at this time, but will schedule later if needed.  Education Materials given:  . Plate Planner with food lists, sample meal pattern . sample menus . Exercise booklet (ADA) . Visit summary with goals/ instructions   Learner/ who was taught:  . Patient   Level of understanding: Marland Kitchen Verbalizes/ demonstrates competency   Demonstrated degree of understanding via:   Teach back Learning barriers: . None  Willingness to learn/ readiness for change: . Eager, change in progress   Monitoring and Evaluation:  Dietary intake, exercise, BG control, renal function, and body weight  follow up: prn

## 2020-10-28 NOTE — Patient Instructions (Signed)
   Great job making healthy food choices and controlling carb intake! Keep up eating generous portions of low-carb veggies and several servings of fruit each day for kidney health and weight control  Keep carbs to 2-3 servings or 30-45 grams with each meal; balance carb and protein in meals to control blood sugar and avoid overworking kidneys.   Continue to limit high sodium foods. Good goal is to keep sodium to 400-600mg  with each meal. 2000mg  or less daily.   Find some manageable and enjoyable exercise; try chair exercise videos or do chair dances to music. Try tracking exercise and giving yourself a reward for certain number of minutes per week, or number of days of exercise.

## 2020-11-08 DIAGNOSIS — I10 Essential (primary) hypertension: Secondary | ICD-10-CM | POA: Diagnosis not present

## 2020-11-08 DIAGNOSIS — N183 Chronic kidney disease, stage 3 unspecified: Secondary | ICD-10-CM | POA: Diagnosis not present

## 2020-11-08 DIAGNOSIS — E1122 Type 2 diabetes mellitus with diabetic chronic kidney disease: Secondary | ICD-10-CM | POA: Diagnosis not present

## 2020-11-08 DIAGNOSIS — E785 Hyperlipidemia, unspecified: Secondary | ICD-10-CM | POA: Diagnosis not present

## 2021-01-23 DIAGNOSIS — E785 Hyperlipidemia, unspecified: Secondary | ICD-10-CM | POA: Diagnosis not present

## 2021-01-23 DIAGNOSIS — E1122 Type 2 diabetes mellitus with diabetic chronic kidney disease: Secondary | ICD-10-CM | POA: Diagnosis not present

## 2021-01-23 DIAGNOSIS — I129 Hypertensive chronic kidney disease with stage 1 through stage 4 chronic kidney disease, or unspecified chronic kidney disease: Secondary | ICD-10-CM | POA: Diagnosis not present

## 2021-01-23 DIAGNOSIS — N183 Chronic kidney disease, stage 3 unspecified: Secondary | ICD-10-CM | POA: Diagnosis not present

## 2021-01-23 DIAGNOSIS — Z6841 Body Mass Index (BMI) 40.0 and over, adult: Secondary | ICD-10-CM | POA: Diagnosis not present

## 2021-02-01 DIAGNOSIS — Z7984 Long term (current) use of oral hypoglycemic drugs: Secondary | ICD-10-CM | POA: Diagnosis not present

## 2021-02-01 DIAGNOSIS — E1122 Type 2 diabetes mellitus with diabetic chronic kidney disease: Secondary | ICD-10-CM | POA: Diagnosis not present

## 2021-02-01 DIAGNOSIS — E785 Hyperlipidemia, unspecified: Secondary | ICD-10-CM | POA: Diagnosis not present

## 2021-02-01 DIAGNOSIS — E0822 Diabetes mellitus due to underlying condition with diabetic chronic kidney disease: Secondary | ICD-10-CM | POA: Diagnosis not present

## 2021-02-01 DIAGNOSIS — N181 Chronic kidney disease, stage 1: Secondary | ICD-10-CM | POA: Diagnosis not present

## 2021-02-01 DIAGNOSIS — E1121 Type 2 diabetes mellitus with diabetic nephropathy: Secondary | ICD-10-CM | POA: Diagnosis not present

## 2021-11-14 IMAGING — US US RENAL ARTERY STENOSIS
1 series · 14 of 25 positions shown · non-contrast
Comparison: None.

CLINICAL DATA: 68-year-old female with a history of uncontrolled
hypertension

EXAM:
RENAL/URINARY TRACT ULTRASOUND
RENAL DUPLEX ULTRASOUND

[Series 1: us renal artery duplex complete · 14 of 81 slices shown]
[im 1/81]
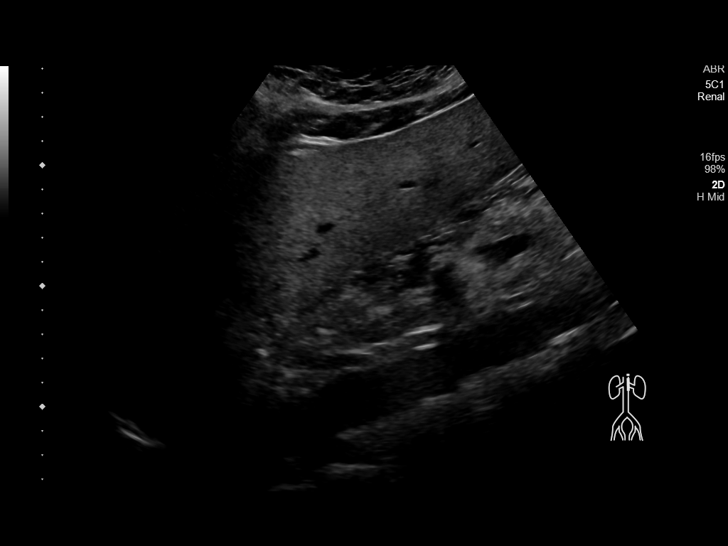
[im 7/81]
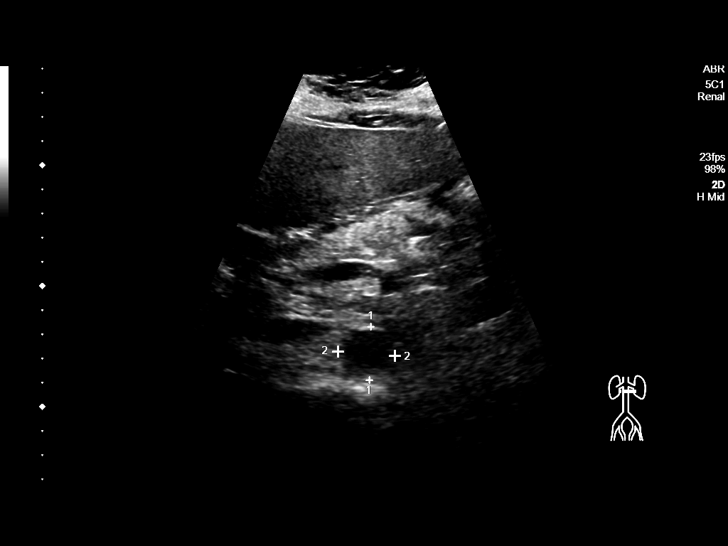
[im 14/81]
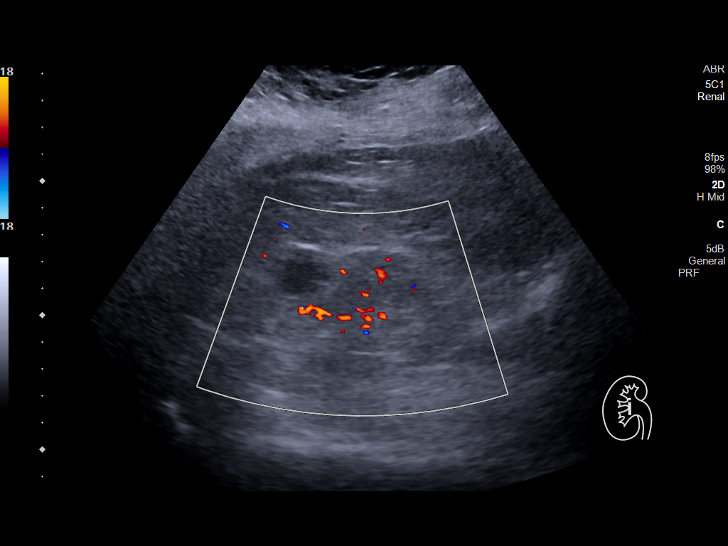
[im 21/81]
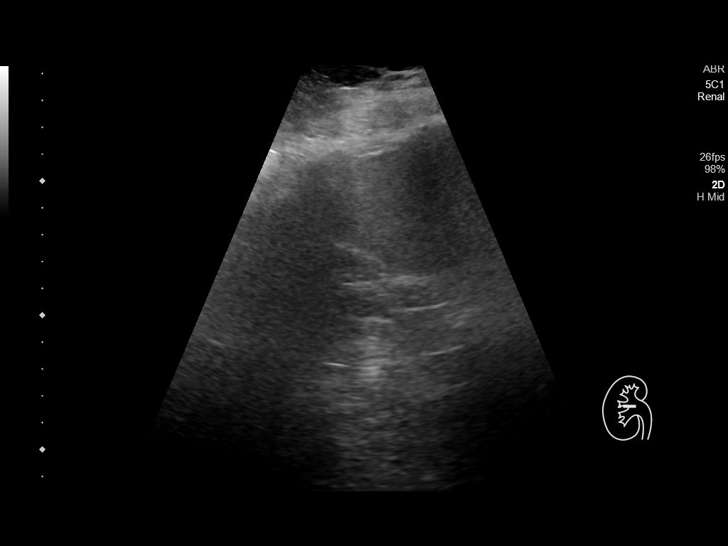
[im 27/81]
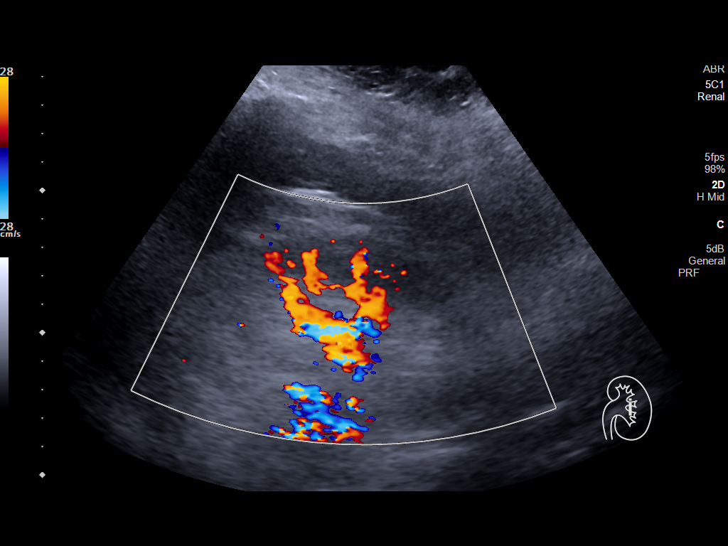
[im 31/81]
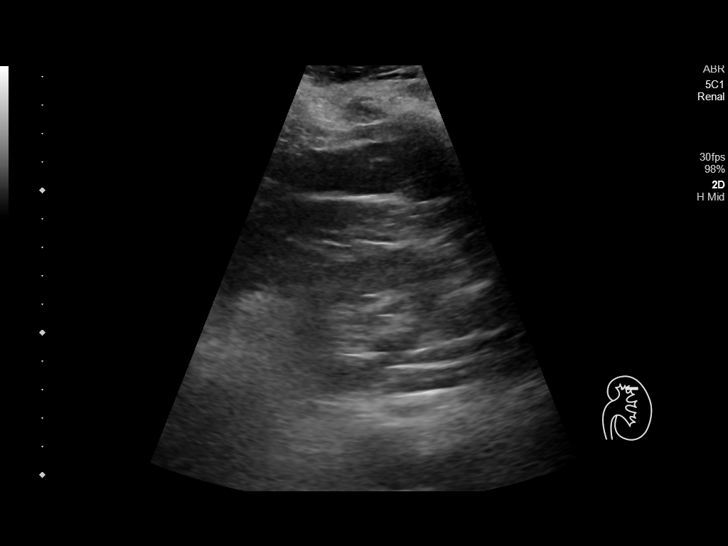
[im 37/81]
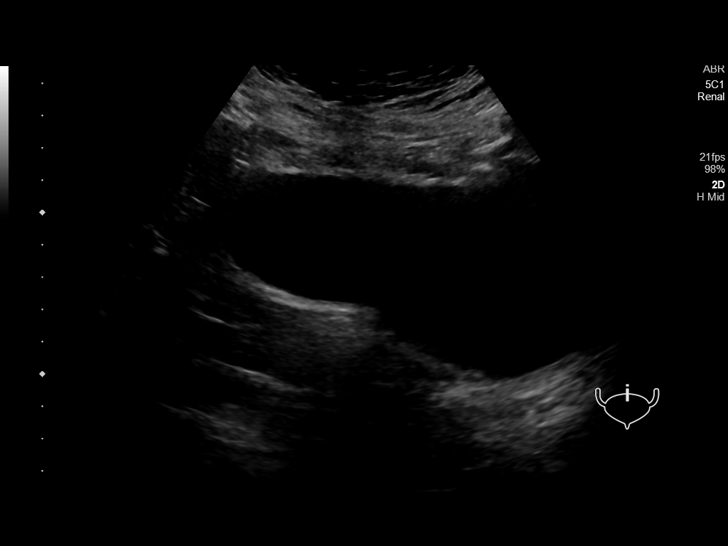
[im 44/81]
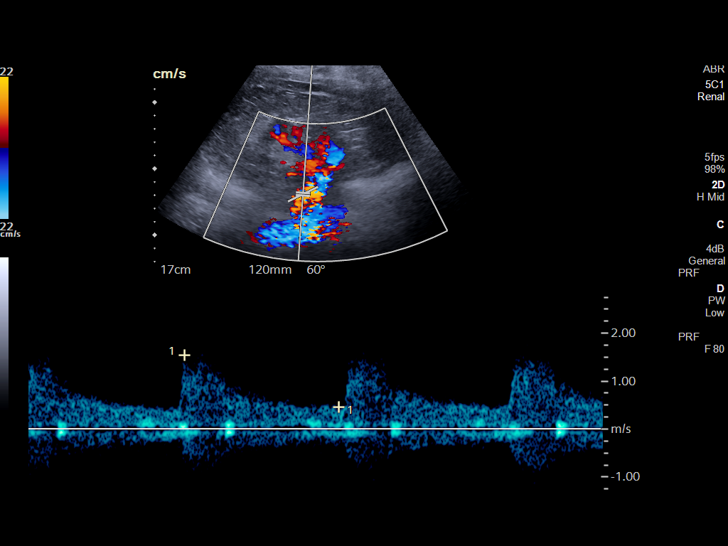
[im 51/81]
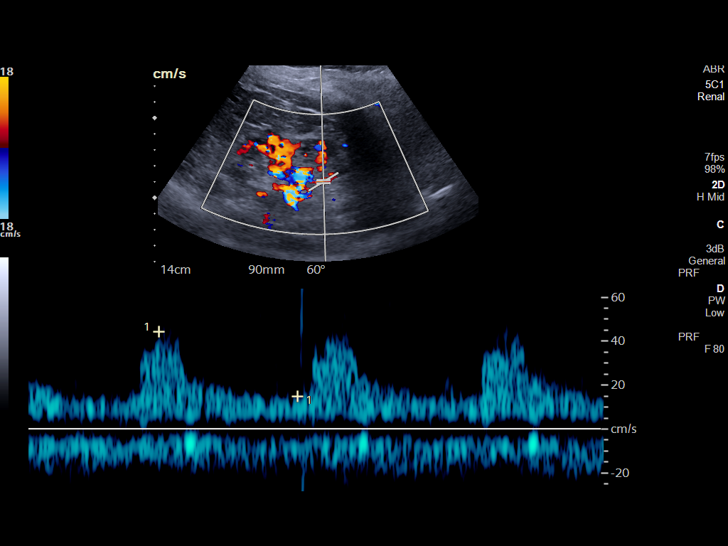
[im 54/81]
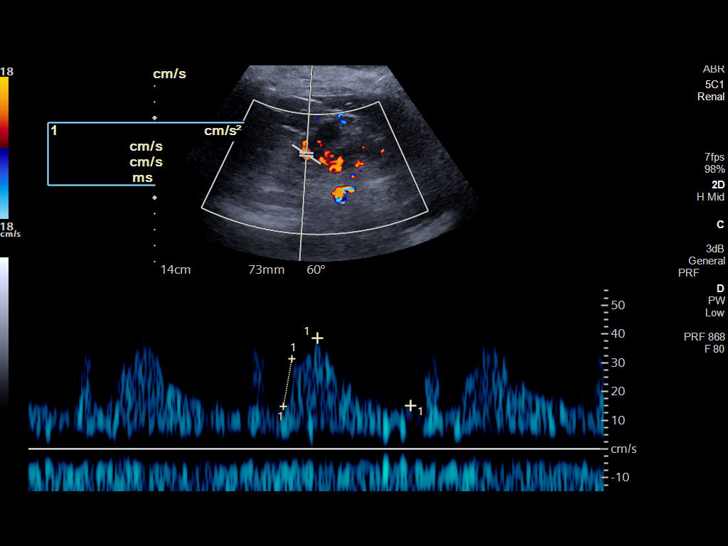
[im 61/81]
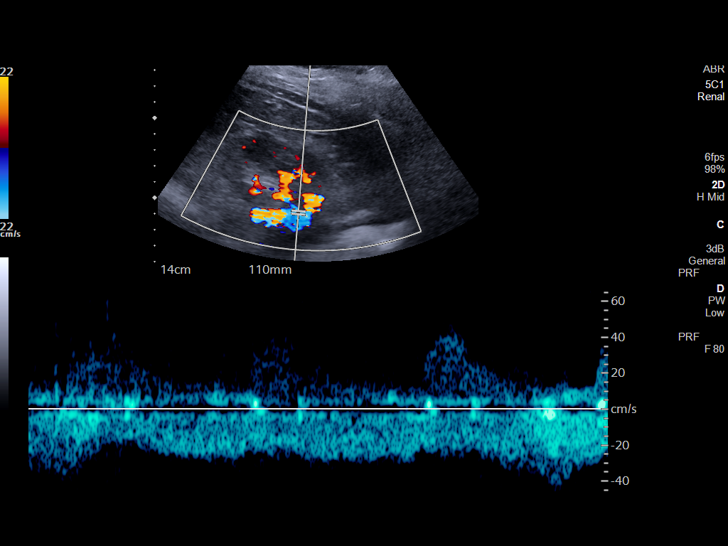
[im 67/81]
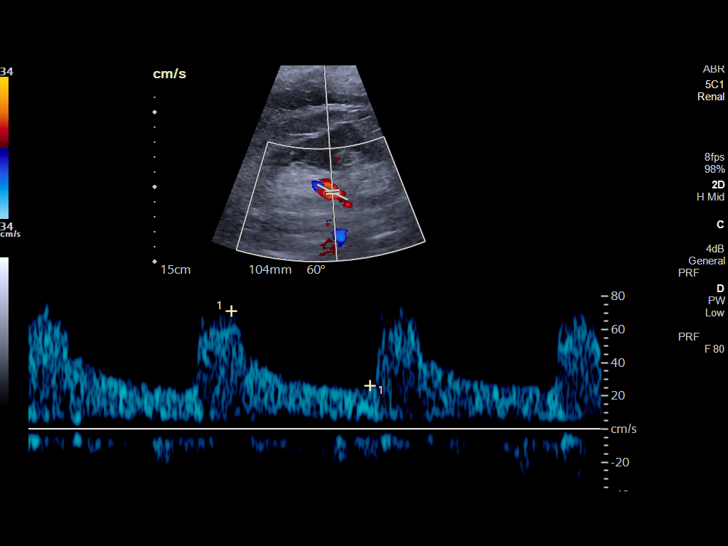
[im 74/81]
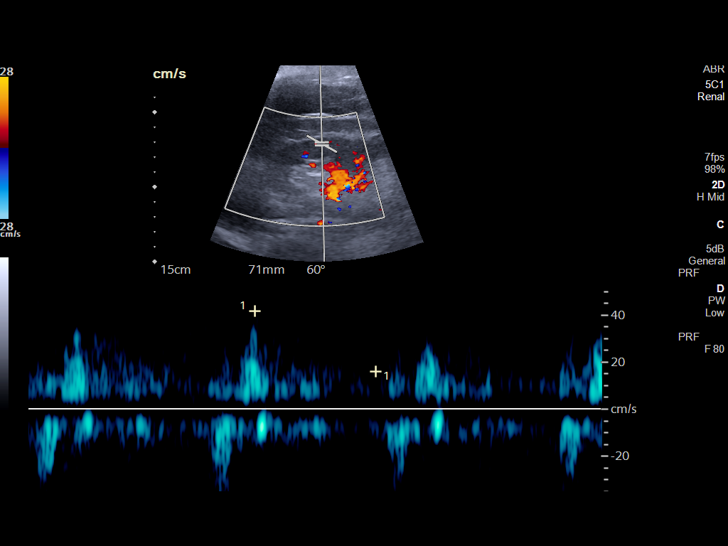
[im 81/81]
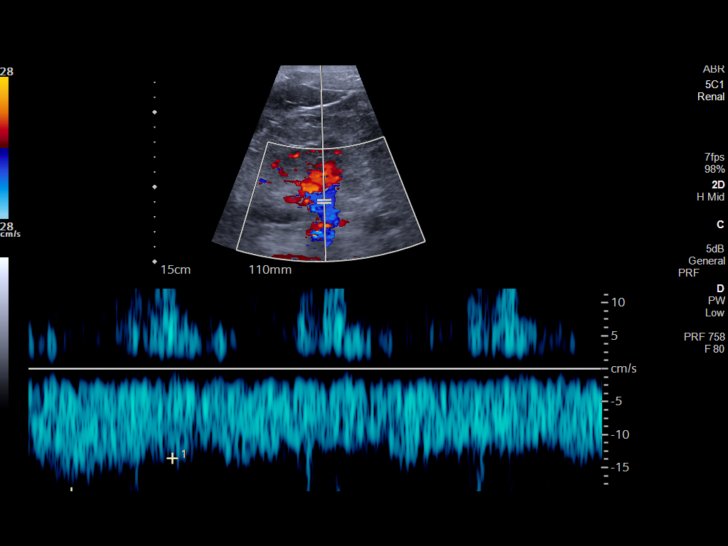

[14 of 25 positions shown; findings below may reference images not displayed]

FINDINGS: Right Kidney:

Length: 8.6 cm. Echogenicity within normal limits. No mass or
hydronephrosis visualized.

Left Kidney:

Length: 9.6 cm. Echogenicity within normal limits. No mass or
hydronephrosis visualized.

Bladder:  Unremarkable

RENAL DUPLEX ULTRASOUND

Right Renal Artery Velocities:

Origin:  117 cm/sec

Mid:  154 cm/sec

Hilum:  71 cm/sec

Interlobar:  45 cm/sec

Arcuate:  57 cm/sec

Left Renal Artery Velocities:

Origin:  200 cm/sec

Mid:  65 cm/sec

Hilum:  71 cm/sec

Interlobar:  62 cm/sec

Arcuate:  42 cm/sec

Aortic Velocity:  107 cm/sec

Right Renal-Aortic Ratios:

Origin:

Mid:

Hilum:

Interlobar:

Arcuate:

Left Renal-Aortic Ratios:

Origin:

Mid:

Hilum:

Interlobar:

Arcuate:
IMPRESSION: Directed duplex of the renal arteries demonstrates no high-grade
stenosis.
